# Patient Record
Sex: Female | Born: 1965
Health system: Southern US, Community
[De-identification: ages and names within clinical notes are randomized; demographics above are authoritative.]

## PROBLEM LIST (undated history)

## (undated) DIAGNOSIS — I10 Essential (primary) hypertension: Secondary | ICD-10-CM

## (undated) DIAGNOSIS — J449 Chronic obstructive pulmonary disease, unspecified: Secondary | ICD-10-CM

## (undated) HISTORY — PX: ABDOMINAL HYSTERECTOMY: SHX81

## (undated) HISTORY — PX: BACK SURGERY: SHX140

## (undated) HISTORY — PX: KNEE SURGERY: SHX244

## (undated) HISTORY — PX: BREAST LUMPECTOMY: SHX2

## (undated) HISTORY — PX: APPENDECTOMY: SHX54

---

## 2016-01-01 ENCOUNTER — Other Ambulatory Visit (HOSPITAL_BASED_OUTPATIENT_CLINIC_OR_DEPARTMENT_OTHER): Payer: Self-pay | Admitting: Internal Medicine

## 2016-01-01 ENCOUNTER — Ambulatory Visit (HOSPITAL_BASED_OUTPATIENT_CLINIC_OR_DEPARTMENT_OTHER)
Admission: RE | Admit: 2016-01-01 | Discharge: 2016-01-01 | Disposition: A | Discharge status: Home / Self Care | Payer: Medicaid Other | Source: Ambulatory Visit | Attending: Internal Medicine | Admitting: Internal Medicine

## 2016-01-01 DIAGNOSIS — R932 Abnormal findings on diagnostic imaging of liver and biliary tract: Secondary | ICD-10-CM | POA: Diagnosis not present

## 2016-01-01 DIAGNOSIS — R1084 Generalized abdominal pain: Secondary | ICD-10-CM

## 2016-08-31 ENCOUNTER — Emergency Department (HOSPITAL_BASED_OUTPATIENT_CLINIC_OR_DEPARTMENT_OTHER): Payer: Medicaid Other

## 2016-08-31 ENCOUNTER — Emergency Department (HOSPITAL_BASED_OUTPATIENT_CLINIC_OR_DEPARTMENT_OTHER)
Admission: EM | Admit: 2016-08-31 | Discharge: 2016-08-31 | Disposition: A | Discharge status: Other Acute Inpatient Hospital | Payer: Medicaid Other | Attending: Emergency Medicine | Admitting: Emergency Medicine

## 2016-08-31 ENCOUNTER — Encounter (HOSPITAL_BASED_OUTPATIENT_CLINIC_OR_DEPARTMENT_OTHER): Payer: Self-pay | Admitting: *Deleted

## 2016-08-31 DIAGNOSIS — J449 Chronic obstructive pulmonary disease, unspecified: Secondary | ICD-10-CM | POA: Diagnosis not present

## 2016-08-31 DIAGNOSIS — I10 Essential (primary) hypertension: Secondary | ICD-10-CM | POA: Diagnosis not present

## 2016-08-31 DIAGNOSIS — R509 Fever, unspecified: Secondary | ICD-10-CM | POA: Diagnosis present

## 2016-08-31 DIAGNOSIS — R7989 Other specified abnormal findings of blood chemistry: Secondary | ICD-10-CM

## 2016-08-31 DIAGNOSIS — Z5181 Encounter for therapeutic drug level monitoring: Secondary | ICD-10-CM | POA: Diagnosis not present

## 2016-08-31 DIAGNOSIS — R945 Abnormal results of liver function studies: Secondary | ICD-10-CM | POA: Insufficient documentation

## 2016-08-31 DIAGNOSIS — A419 Sepsis, unspecified organism: Secondary | ICD-10-CM | POA: Insufficient documentation

## 2016-08-31 DIAGNOSIS — N390 Urinary tract infection, site not specified: Secondary | ICD-10-CM | POA: Diagnosis not present

## 2016-08-31 DIAGNOSIS — F17210 Nicotine dependence, cigarettes, uncomplicated: Secondary | ICD-10-CM | POA: Diagnosis not present

## 2016-08-31 HISTORY — DX: Chronic obstructive pulmonary disease, unspecified: J44.9

## 2016-08-31 HISTORY — DX: Essential (primary) hypertension: I10

## 2016-08-31 LAB — URINE MICROSCOPIC-ADD ON

## 2016-08-31 LAB — COMPREHENSIVE METABOLIC PANEL
ALBUMIN: 3.2 g/dL — AB (ref 3.5–5.0)
ALK PHOS: 146 U/L — AB (ref 38–126)
ALT: 91 U/L — ABNORMAL HIGH (ref 14–54)
AST: 168 U/L — AB (ref 15–41)
Anion gap: 13 (ref 5–15)
BILIRUBIN TOTAL: 2.4 mg/dL — AB (ref 0.3–1.2)
BUN: 10 mg/dL (ref 6–20)
CO2: 23 mmol/L (ref 22–32)
Calcium: 8.6 mg/dL — ABNORMAL LOW (ref 8.9–10.3)
Chloride: 91 mmol/L — ABNORMAL LOW (ref 101–111)
Creatinine, Ser: 0.87 mg/dL (ref 0.44–1.00)
GFR calc Af Amer: >60 mL/min (ref >60)
GFR calc non Af Amer: >60 mL/min (ref >60)
GLUCOSE: 100 mg/dL — AB (ref 65–99)
POTASSIUM: 3.4 mmol/L — AB (ref 3.5–5.1)
Sodium: 127 mmol/L — ABNORMAL LOW (ref 135–145)
TOTAL PROTEIN: 7.3 g/dL (ref 6.5–8.1)

## 2016-08-31 LAB — RAPID URINE DRUG SCREEN, HOSP PERFORMED
AMPHETAMINES: NOT DETECTED
BENZODIAZEPINES: NOT DETECTED
Barbiturates: NOT DETECTED
Cocaine: NOT DETECTED
OPIATES: NOT DETECTED
Tetrahydrocannabinol: POSITIVE — AB

## 2016-08-31 LAB — URINALYSIS, ROUTINE W REFLEX MICROSCOPIC
Glucose, UA: NEGATIVE mg/dL
Ketones, ur: 15 mg/dL — AB
NITRITE: POSITIVE — AB
PH: 6 (ref 5.0–8.0)
Protein, ur: 100 mg/dL — AB
SPECIFIC GRAVITY, URINE: 1.018 (ref 1.005–1.030)

## 2016-08-31 LAB — CBC WITH DIFFERENTIAL/PLATELET
BAND NEUTROPHILS: 1 %
BASOS PCT: 0 %
Basophils Absolute: 0 10*3/uL (ref 0.0–0.1)
EOS ABS: 0 10*3/uL (ref 0.0–0.7)
EOS PCT: 0 %
HEMATOCRIT: 41.7 % (ref 36.0–46.0)
HEMOGLOBIN: 14.8 g/dL (ref 12.0–15.0)
Lymphocytes Relative: 18 %
Lymphs Abs: 2 10*3/uL (ref 0.7–4.0)
MCH: 35.9 pg — AB (ref 26.0–34.0)
MCHC: 35.5 g/dL (ref 30.0–36.0)
MCV: 101.2 fL — AB (ref 78.0–100.0)
MONOS PCT: 5 %
Monocytes Absolute: 0.6 10*3/uL (ref 0.1–1.0)
NEUTROS ABS: 8.6 10*3/uL — AB (ref 1.7–7.7)
Neutrophils Relative %: 76 %
Platelets: 97 10*3/uL — ABNORMAL LOW (ref 150–400)
RBC: 4.12 MIL/uL (ref 3.87–5.11)
RDW: 13 % (ref 11.5–15.5)
WBC: 11.2 10*3/uL — ABNORMAL HIGH (ref 4.0–10.5)

## 2016-08-31 LAB — ETHANOL: Alcohol, Ethyl (B): <5 mg/dL (ref <5)

## 2016-08-31 LAB — I-STAT CG4 LACTIC ACID, ED
Lactic Acid, Venous: 2.3 mmol/L (ref 0.5–1.9)
Lactic Acid, Venous: 1.28 mmol/L (ref 0.5–1.9)

## 2016-08-31 LAB — RAPID STREP SCREEN (MED CTR MEBANE ONLY): STREPTOCOCCUS, GROUP A SCREEN (DIRECT): NEGATIVE

## 2016-08-31 LAB — LIPASE, BLOOD: Lipase: 32 U/L (ref 11–51)

## 2016-08-31 LAB — SALICYLATE LEVEL: Salicylate Lvl: <7 mg/dL (ref 2.8–30.0)

## 2016-08-31 LAB — INFLUENZA PANEL BY PCR (TYPE A & B)
Influenza A By PCR: NEGATIVE
Influenza B By PCR: NEGATIVE

## 2016-08-31 LAB — PROTIME-INR
INR: 1.01
Prothrombin Time: 13.3 seconds (ref 11.4–15.2)

## 2016-08-31 LAB — ACETAMINOPHEN LEVEL

## 2016-08-31 LAB — AMMONIA: Ammonia: 41 umol/L — ABNORMAL HIGH (ref 9–35)

## 2016-08-31 MED ORDER — ONDANSETRON HCL 4 MG/2ML IJ SOLN
4.0000 mg | Freq: Once | INTRAMUSCULAR | Status: AC
  Administered 2016-08-31: 4 mg via INTRAVENOUS
  Filled 2016-08-31: qty 2

## 2016-08-31 MED ORDER — PIPERACILLIN-TAZOBACTAM 3.375 G IVPB 30 MIN
3.3750 g | Freq: Once | INTRAVENOUS | Status: AC
  Administered 2016-08-31: 3.375 g via INTRAVENOUS
  Filled 2016-08-31 (×2): qty 50

## 2016-08-31 MED ORDER — SODIUM CHLORIDE 0.9 % IV BOLUS (SEPSIS)
1000.0000 mL | Freq: Once | INTRAVENOUS | Status: AC
  Administered 2016-08-31: 1000 mL via INTRAVENOUS

## 2016-08-31 MED ORDER — VANCOMYCIN HCL IN DEXTROSE 1-5 GM/200ML-% IV SOLN
1000.0000 mg | Freq: Once | INTRAVENOUS | Status: AC
  Administered 2016-08-31: 1000 mg via INTRAVENOUS
  Filled 2016-08-31: qty 200

## 2016-08-31 MED ORDER — IOPAMIDOL (ISOVUE-300) INJECTION 61%
100.0000 mL | Freq: Once | INTRAVENOUS | Status: AC | PRN
  Administered 2016-08-31: 100 mL via INTRAVENOUS

## 2016-08-31 MED ORDER — SODIUM CHLORIDE 0.9 % IV BOLUS (SEPSIS)
2000.0000 mL | Freq: Once | INTRAVENOUS | Status: AC
  Administered 2016-08-31: 2000 mL via INTRAVENOUS

## 2016-08-31 NOTE — ED Notes (Addendum)
[facility]   Recalled [place] hospitalist for admission

## 2016-08-31 NOTE — ED Provider Notes (Signed)
[facility]-EMERGENCY DEPT [facility] Provider Note   CSN: [id number] Arrival date & time: 08/31/16  1143     History   Chief Complaint Chief Complaint  Patient presents with  . Fever  . Emesis    HPI Sarah Jordan is a 50 y.o. female.  HPI Patient presents with multiple episodes of vomiting starting Saturday. She's had diffuse body aches, fever and chills. She's had no bowel movement. She's having intermittent right sided upper abdominal pain. Chronic back pain which she does not believe has changed significantly. No neck stiffness. No known sick contacts. Past Medical History:  Diagnosis Date  . COPD (chronic obstructive pulmonary disease) (HCC)   . Hypertension     There are no active problems to display for this patient.   Past Surgical History:  Procedure Laterality Date  . ABDOMINAL HYSTERECTOMY    . APPENDECTOMY    . BACK SURGERY    . BREAST LUMPECTOMY    . CESAREAN SECTION    . KNEE SURGERY      OB History    No data available       Home Medications    Prior to Admission medications   Not on File    Family History No family history on file.  Social History Social History  Substance Use Topics  . Smoking status: Current Every Day Smoker    Packs/day: 1.00    Types: Cigarettes  . Smokeless tobacco: Never Used  . Alcohol use Yes     Comment: 2 drinks/ week     Allergies   Lyrica [pregabalin]   Review of Systems Review of Systems  Constitutional: Positive for appetite change, chills, fatigue and fever.  HENT: Negative for congestion, sinus pain and sore throat.   Eyes: Negative for visual disturbance.  Respiratory: Negative for cough and shortness of breath.   Cardiovascular: Negative for chest pain.  Gastrointestinal: Positive for abdominal pain, nausea and vomiting. Negative for blood in stool, constipation and diarrhea.  Genitourinary: Positive for flank pain. Negative for difficulty urinating, dysuria and hematuria.  Musculoskeletal:  Positive for back pain, myalgias and neck pain. Negative for neck stiffness.  Skin: Negative for rash and wound.  Neurological: Positive for weakness (generalized), light-headedness and headaches. Negative for numbness.  All other systems reviewed and are negative.    Physical Exam Updated Vital Signs BP 119/85   Pulse 99   Temp 98.1 F (36.7 C) (Oral)   Resp 15   Ht 5\' 10"  (1.778 m)   Wt 187 lb (84.8 kg)   SpO2 94%   BMI 26.83 kg/m   Physical Exam  Constitutional: She is oriented to person, place, and time. She appears well-developed and well-nourished. No distress.  HENT:  Head: Normocephalic and atraumatic.  Mouth/Throat: Oropharynx is clear and moist.  Oropharynx is erythematous.  Eyes: EOM are normal. Pupils are equal, round, and reactive to light. Right eye exhibits no discharge. Left eye exhibits no discharge.  Neck: Normal range of motion. Neck supple.  No meningismus  Cardiovascular: Normal rate and regular rhythm.  Exam reveals no gallop.   No murmur heard. Pulmonary/Chest: Effort normal and breath sounds normal.  Mildly diminished breath sounds in bilateral bases  Abdominal: Soft. Bowel sounds are normal. There is tenderness (mild diffuse abdominal tenderness). There is no rebound and no guarding.  Musculoskeletal: Normal range of motion. She exhibits no edema or tenderness.  Patient has diffuse thoracic and lumbar muscular tenderness. No definite CVA tenderness.  Lymphadenopathy:    She has  no cervical adenopathy.  Neurological: She is oriented to person, place, and time.  Patient appears mildly confused. 5/5 strength in all extremities. Sensation is fully intact.  Skin: Skin is warm and dry. Capillary refill takes less than 2 seconds. No rash noted. No erythema.  Psychiatric: She has a normal mood and affect. Her behavior is normal.  Nursing note and vitals reviewed.    ED Treatments / Results  Labs (all labs ordered are listed, but only abnormal results  are displayed) Labs Reviewed  URINALYSIS, ROUTINE W REFLEX MICROSCOPIC (NOT AT [facility]) - Abnormal; Notable for the following:       Result Value   Color, Urine ORANGE (*)    APPearance TURBID (*)    Hgb urine dipstick SMALL (*)    Bilirubin Urine LARGE (*)    Ketones, ur 15 (*)    Protein, ur 100 (*)    Nitrite POSITIVE (*)    Leukocytes, UA LARGE (*)    All other components within normal limits  RAPID URINE DRUG SCREEN, HOSP PERFORMED - Abnormal; Notable for the following:    Tetrahydrocannabinol POSITIVE (*)    All other components within normal limits  ACETAMINOPHEN LEVEL - Abnormal; Notable for the following:    Acetaminophen (Tylenol), Serum <10 (*)    All other components within normal limits  AMMONIA - Abnormal; Notable for the following:    Ammonia 41 (*)    All other components within normal limits  COMPREHENSIVE METABOLIC PANEL - Abnormal; Notable for the following:    Sodium 127 (*)    Potassium 3.4 (*)    Chloride 91 (*)    Glucose, Bld 100 (*)    Calcium 8.6 (*)    Albumin 3.2 (*)    AST 168 (*)    ALT 91 (*)    Alkaline Phosphatase 146 (*)    Total Bilirubin 2.4 (*)    All other components within normal limits  CBC WITH DIFFERENTIAL/PLATELET - Abnormal; Notable for the following:    WBC 11.2 (*)    MCV 101.2 (*)    MCH 35.9 (*)    Platelets 97 (*)    Neutro Abs 8.6 (*)    All other components within normal limits  URINE MICROSCOPIC-ADD ON - Abnormal; Notable for the following:    Squamous Epithelial / LPF 6-30 (*)    Bacteria, UA MANY (*)    All other components within normal limits  I-STAT CG4 LACTIC ACID, ED - Abnormal; Notable for the following:    Lactic Acid, Venous 2.30 (*)    All other components within normal limits  RAPID STREP SCREEN (NOT AT [facility])  URINE CULTURE  CULTURE, BLOOD (ROUTINE X 2)  CULTURE, BLOOD (ROUTINE X 2)  LIPASE, BLOOD  ETHANOL  PROTIME-INR  SALICYLATE LEVEL  CBC WITH DIFFERENTIAL/PLATELET  INFLUENZA PANEL BY PCR (TYPE A  & B, H1N1)  I-STAT CG4 LACTIC ACID, ED    EKG  EKG Interpretation  Date/Time:  Tuesday August 31 2016 11:59:21 EST Ventricular Rate:  109 PR Interval:    QRS Duration: 99 QT Interval:  328 QTC Calculation: 442 R Axis:   35 Text Interpretation:  Sinus tachycardia Low voltage, precordial leads Probable anteroseptal infarct, old Confirmed by Ranae PalmsYELVERTON  MD, Rambo Sarafian ([id number]) on 08/31/2016 2:05:25 PM       Radiology Dg Chest 2 View  Result Date: 08/31/2016 CLINICAL DATA:  Fever EXAM: CHEST  2 VIEW COMPARISON:  None. FINDINGS: Normal heart size. Normal mediastinal contour. No pneumothorax.  No pleural effusion. Minimal scarring versus atelectasis at the left costophrenic angle. No pulmonary edema. No acute consolidative airspace disease. IMPRESSION: Minimal scarring versus atelectasis at the left costophrenic angle. Otherwise no active disease in the chest. Electronically Signed   By: Delbert Phenix M.D.   On: 08/31/2016 12:36   Ct Abdomen Pelvis W Contrast  Result Date: 08/31/2016 CLINICAL DATA:  Abdominal pain with fever EXAM: CT ABDOMEN AND PELVIS WITH CONTRAST TECHNIQUE: Multidetector CT imaging of the abdomen and pelvis was performed using the standard protocol following bolus administration of intravenous contrast. CONTRAST:  ISOVUE-300 IOPAMIDOL (ISOVUE-300) INJECTION 61% COMPARISON:  None. FINDINGS: Lower chest: There is slight bibasilar atelectasis. There is a small hiatal hernia. Hepatobiliary: There is hepatic steatosis. No focal liver lesions are evident. Gallbladder wall is not appreciably thickened. There is no biliary duct dilatation. Pancreas: There is no pancreatic mass inflammatory focus. Spleen: No splenic lesions are evident. Adrenals/Urinary Tract: Adrenals appear unremarkable bilaterally. Kidneys bilaterally show no evidence of mass or hydronephrosis on either side. There is no renal or ureteral calculus on either side. Urinary bladder is midline with wall thickness  within normal limits. Stomach/Bowel: There is no bowel wall or mesenteric thickening. There is no bowel obstruction. There is no free air or portal venous air. Vascular/Lymphatic: There are foci of atherosclerotic calcification in the aorta. There is no demonstrable abdominal aortic aneurysm. The major mesenteric vessels appear patent. There is no appreciable adenopathy abdomen or pelvis. Reproductive: Uterus is absent. There is no pelvic mass pelvic fluid collection. Other: Appendix absent. No periappendiceal region inflammation. There is no demonstrable ascites or abscess in the abdomen or pelvis. Musculoskeletal: There is postoperative change at L5-S1 with support hardware intact. There is degenerative change at L5-S1. There is also degenerative type change in the lower thoracic and upper lumbar regions with vacuum phenomenon at T12-L1 and L1-2. There are no blastic or lytic bone lesions. There is no intramuscular or abdominal wall lesions. IMPRESSION: No bowel wall or mesenteric thickening. No bowel obstruction. No abscess. Appendix absent. No renal or ureteral calculus. No hydronephrosis. Hepatic steatosis without focal liver lesion. Small hiatal hernia. Postoperative change at L5-S1. Areas of degenerative change in the lower thoracic upper lumbar regions as well as L5-S1. Aortic atherosclerosis. Electronically Signed   By: Bretta Bang III M.D.   On: 08/31/2016 15:10   US Abdomen Limited  Result Date: 08/31/2016 CLINICAL DATA:  Nausea vomiting and right upper quadrant pain for 4 days EXAM: US ABDOMEN LIMITED - RIGHT UPPER QUADRANT COMPARISON:  01/01/2016 FINDINGS: Gallbladder: Small amount of echogenic sludge is present within the gallbladder lumen. No wall thickening. Negative sonographic Murphy sign. Common bile duct: Diameter: 2.9 mm Liver: No focal hepatic abnormalities. Diffuse increased echogenicity consistent with fatty infiltration. IMPRESSION: 1. Small amount of sludge within the gallbladder  lumen. No wall thickening and negative sonographic Murphy's. Normal common bile duct diameter. 2. Diffuse increased echogenicity of the hepatic parenchyma consistent with fatty infiltration. Electronically Signed   By: Jasmine Pang M.D.   On: 08/31/2016 14:22    Procedures Procedures (including critical care time)  Medications Ordered in ED Medications  vancomycin (VANCOCIN) IVPB 1000 mg/200 mL premix (1,000 mg Intravenous New Bag/Given 08/31/16 1515)  sodium chloride 0.9 % bolus 2,000 mL (0 mLs Intravenous Stopped 08/31/16 1547)  ondansetron (ZOFRAN) injection 4 mg (4 mg Intravenous Given 08/31/16 1234)  piperacillin-tazobactam (ZOSYN) IVPB 3.375 g (0 g Intravenous Stopped 08/31/16 1445)  sodium chloride 0.9 % bolus 1,000 mL (1,000  mLs Intravenous New Bag/Given 08/31/16 1520)  iopamidol (ISOVUE-300) 61 % injection 100 mL (100 mLs Intravenous Contrast Given 08/31/16 1451)     Initial Impression / Assessment and Plan / ED Course  I have reviewed the triage vital signs and the nursing notes.  Pertinent labs & imaging results that were available during my care of the patient were reviewed by me and considered in my medical decision making (see chart for details).  Clinical Course    Lactic acid is improved with IV fluids. Patient's heart rate has normalized as well as blood pressure. Suspect patient may have pyelonephritis. Start on IV antibiotics in the emergency department. Will speak with hospitalist at [facility] regarding transfer and admission.  Final Clinical Impressions(s) / ED Diagnoses   Final diagnoses:  Sepsis due to urinary tract infection [facility])  Liver function test abnormality   Discussed with hospitalist at [facility]. Will accept in transfer to MedSurg bed. New Prescriptions New Prescriptions   No medications on file     Loren Racer, MD 08/31/16 [phone]

## 2016-08-31 NOTE — ED Notes (Signed)
Patient to [facility] with HP1

## 2016-08-31 NOTE — ED Triage Notes (Signed)
Pt reports fever, nausea, vomiting, body aches since Saturday. States PCP told her she has the flu (no testing done) and sent her here for IV fluids

## 2016-08-31 NOTE — ED Triage Notes (Signed)
Dr. Ranae PalmsYelverton made aware of pt's VS and lactic acid 2.30

## 2016-09-01 LAB — BLOOD CULTURE ID PANEL (REFLEXED)
Acinetobacter baumannii: NOT DETECTED
CARBAPENEM RESISTANCE: NOT DETECTED
Candida albicans: NOT DETECTED
Candida glabrata: NOT DETECTED
Candida krusei: NOT DETECTED
Candida parapsilosis: NOT DETECTED
Candida tropicalis: NOT DETECTED
ENTEROBACTERIACEAE SPECIES: DETECTED — AB
ENTEROCOCCUS SPECIES: NOT DETECTED
Enterobacter cloacae complex: NOT DETECTED
Escherichia coli: DETECTED — AB
HAEMOPHILUS INFLUENZAE: NOT DETECTED
Klebsiella oxytoca: NOT DETECTED
Klebsiella pneumoniae: NOT DETECTED
LISTERIA MONOCYTOGENES: NOT DETECTED
Neisseria meningitidis: NOT DETECTED
PSEUDOMONAS AERUGINOSA: NOT DETECTED
Proteus species: NOT DETECTED
STAPHYLOCOCCUS AUREUS BCID: NOT DETECTED
STAPHYLOCOCCUS SPECIES: NOT DETECTED
STREPTOCOCCUS PNEUMONIAE: NOT DETECTED
STREPTOCOCCUS PYOGENES: NOT DETECTED
STREPTOCOCCUS SPECIES: NOT DETECTED
Serratia marcescens: NOT DETECTED
Streptococcus agalactiae: NOT DETECTED

## 2016-09-01 NOTE — ED Notes (Signed)
Maria, micro lab notifies this rn that pt aerobic blood culture has grown out e. Coli and gram neg rods. This rn phones [facility] "Mark, Nursing Supervisor" alerted to culture results and states will follow up. Micro lab phoned and requested to fax results to [facility] at [phone]. [facility].

## 2016-09-03 LAB — CULTURE, GROUP A STREP (THRC)

## 2016-09-03 LAB — URINE CULTURE

## 2016-09-03 LAB — CULTURE, BLOOD (ROUTINE X 2)

## 2016-09-04 ENCOUNTER — Telehealth (HOSPITAL_BASED_OUTPATIENT_CLINIC_OR_DEPARTMENT_OTHER): Payer: Self-pay

## 2016-09-04 NOTE — Progress Notes (Signed)
ED Antimicrobial Stewardship Positive Culture Follow Up   Sarah Jordan is an 50 y.o. female who [facility] to [facility] with a chief complaint of fever and emesis and was started on vancomycin and zosyn for sepsis. Chief Complaint  Patient presents with  . Fever  . Emesis    Recent Results (from the past 720 hour(s))  Culture, blood (Routine x 2)     Status: None (Preliminary result)   Collection Time: 08/31/16 12:05 PM  Result Value Ref Range Status   Specimen Description BLOOD RIGHT HAND  Final   Special Requests BOTTLES DRAWN AEROBIC AND ANAEROBIC 5CC EACH  Final   Culture   Final    NO GROWTH 3 DAYS [facility]    Report Status PENDING  Incomplete  Culture, blood (Routine x 2)     Status: Abnormal   Collection Time: 08/31/16 12:43 PM  Result Value Ref Range Status   Specimen Description BLOOD RIGHT AC  Final   Special Requests BOTTLES DRAWN AEROBIC AND ANAEROBIC 5CC EACH  Final   Culture  Setup Time   Final    GRAM NEGATIVE RODS AEROBIC BOTTLE ONLY CRITICAL RESULT CALLED TO, READ BACK BY AND VERIFIED WITH: AMY HARTLEY,RN @0722  09/01/16 MKELLY,MLT Performed [facility]    Culture ESCHERICHIA COLI (A)  Final   Report Status 09/03/2016 FINAL  Final   Organism ID, Bacteria ESCHERICHIA COLI  Final      Susceptibility   Escherichia coli - MIC*    AMPICILLIN >=32 RESISTANT Resistant     CEFAZOLIN <=4 SENSITIVE Sensitive     CEFEPIME <=1 SENSITIVE Sensitive     CEFTAZIDIME <=1 SENSITIVE Sensitive     CEFTRIAXONE <=1 SENSITIVE Sensitive     CIPROFLOXACIN <=0.25 SENSITIVE Sensitive     GENTAMICIN >=16 RESISTANT Resistant     IMIPENEM <=0.25 SENSITIVE Sensitive     TRIMETH/SULFA >=320 RESISTANT Resistant     AMPICILLIN/SULBACTAM >=32 RESISTANT Resistant     PIP/TAZO <=4 SENSITIVE Sensitive     Extended ESBL NEGATIVE Sensitive     * ESCHERICHIA COLI  Blood Culture ID Panel (Reflexed)     Status: Abnormal   Collection Time:  08/31/16 12:43 PM  Result Value Ref Range Status   Enterococcus species NOT DETECTED NOT DETECTED Final   Listeria monocytogenes NOT DETECTED NOT DETECTED Final   Staphylococcus species NOT DETECTED NOT DETECTED Final   Staphylococcus aureus NOT DETECTED NOT DETECTED Final   Streptococcus species NOT DETECTED NOT DETECTED Final   Streptococcus agalactiae NOT DETECTED NOT DETECTED Final   Streptococcus pneumoniae NOT DETECTED NOT DETECTED Final   Streptococcus pyogenes NOT DETECTED NOT DETECTED Final   Acinetobacter baumannii NOT DETECTED NOT DETECTED Final   Enterobacteriaceae species DETECTED (A) NOT DETECTED Final    Comment: CRITICAL RESULT CALLED TO, READ BACK BY AND VERIFIED WITH: AMY HARTLEY,RN @0722  09/01/16 MKELLY    Enterobacter cloacae complex NOT DETECTED NOT DETECTED Final   Escherichia coli DETECTED (A) NOT DETECTED Final    Comment: CRITICAL RESULT CALLED TO, READ BACK BY AND VERIFIED WITH: AMY HARTLEY,RN @0722  09/01/16 MKELLY,MLT    Klebsiella oxytoca NOT DETECTED NOT DETECTED Final   Klebsiella pneumoniae NOT DETECTED NOT DETECTED Final   Proteus species NOT DETECTED NOT DETECTED Final   Serratia marcescens NOT DETECTED NOT DETECTED Final   Carbapenem resistance NOT DETECTED NOT DETECTED Final   Haemophilus influenzae NOT DETECTED NOT DETECTED Final   Neisseria meningitidis NOT DETECTED NOT DETECTED Final  Pseudomonas aeruginosa NOT DETECTED NOT DETECTED Final   Candida albicans NOT DETECTED NOT DETECTED Final   Candida glabrata NOT DETECTED NOT DETECTED Final   Candida krusei NOT DETECTED NOT DETECTED Final   Candida parapsilosis NOT DETECTED NOT DETECTED Final   Candida tropicalis NOT DETECTED NOT DETECTED Final    Comment: Performed at [facility]  Rapid strep screen     Status: None   Collection Time: 08/31/16  1:05 PM  Result Value Ref Range Status   Streptococcus, Group A Screen (Direct) NEGATIVE NEGATIVE Final    Comment: (NOTE) A Rapid Antigen  test may result negative if the antigen level in the sample is below the detection level of this test. The FDA has not cleared this test as a stand-alone test therefore the rapid antigen negative result has reflexed to a Group A Strep culture.   Culture, group A strep     Status: None   Collection Time: 08/31/16  1:05 PM  Result Value Ref Range Status   Specimen Description THROAT  Final   Special Requests NONE  Final   Culture   Final    NO GROUP A STREP (S.PYOGENES) ISOLATED Performed at [facility]    Report Status 09/03/2016 FINAL  Final  Urine culture     Status: Abnormal   Collection Time: 08/31/16  1:15 PM  Result Value Ref Range Status   Specimen Description URINE, RANDOM  Final   Special Requests NONE  Final   Culture >=100,000 COLONIES/mL ESCHERICHIA COLI (A)  Final   Report Status 09/03/2016 FINAL  Final   Organism ID, Bacteria ESCHERICHIA COLI (A)  Final      Susceptibility   Escherichia coli - MIC*    AMPICILLIN >=32 RESISTANT Resistant     CEFAZOLIN <=4 SENSITIVE Sensitive     CEFTRIAXONE <=1 SENSITIVE Sensitive     CIPROFLOXACIN <=0.25 SENSITIVE Sensitive     GENTAMICIN >=16 RESISTANT Resistant     IMIPENEM <=0.25 SENSITIVE Sensitive     NITROFURANTOIN <=16 SENSITIVE Sensitive     TRIMETH/SULFA <=20 SENSITIVE Sensitive     AMPICILLIN/SULBACTAM 16 INTERMEDIATE Intermediate     PIP/TAZO <=4 SENSITIVE Sensitive     Extended ESBL NEGATIVE Sensitive     * >=100,000 COLONIES/mL ESCHERICHIA COLI    [x]  Treated with vancomycin and zosyn and [facility]. Requesting that results are sent to [facility] []  Patient discharged originally without antimicrobial agent and treatment is now indicated  New antibiotic prescription: Would recommend ceftriaxone based on sensitivities.  ED Provider: Azucena FreedAbigail Harris   Sarah Jordan, PharmD, BCPS Clinical Pharmacist 09/04/2016, 9:17 AM Infectious Diseases Pharmacist

## 2016-09-04 NOTE — Telephone Encounter (Signed)
UC and BC report [facility] [phone] Pt in [phone] [phone]

## 2016-09-05 LAB — CULTURE, BLOOD (ROUTINE X 2): CULTURE: NO GROWTH

## 2017-03-21 ENCOUNTER — Other Ambulatory Visit: Payer: Self-pay | Admitting: Internal Medicine

## 2017-03-21 DIAGNOSIS — J449 Chronic obstructive pulmonary disease, unspecified: Secondary | ICD-10-CM

## 2017-03-21 DIAGNOSIS — R0602 Shortness of breath: Secondary | ICD-10-CM

## 2017-04-05 ENCOUNTER — Ambulatory Visit
Admission: RE | Admit: 2017-04-05 | Discharge: 2017-04-05 | Disposition: A | Discharge status: Home / Self Care | Payer: Medicaid Other | Source: Ambulatory Visit | Attending: Internal Medicine | Admitting: Internal Medicine

## 2017-04-05 DIAGNOSIS — R0602 Shortness of breath: Secondary | ICD-10-CM

## 2017-04-05 DIAGNOSIS — J449 Chronic obstructive pulmonary disease, unspecified: Secondary | ICD-10-CM

## 2017-04-05 MED ORDER — IOPAMIDOL (ISOVUE-300) INJECTION 61%
75.0000 mL | Freq: Once | INTRAVENOUS | Status: AC | PRN
  Administered 2017-04-05: 75 mL via INTRAVENOUS

## 2017-04-12 DIAGNOSIS — J309 Allergic rhinitis, unspecified: Secondary | ICD-10-CM | POA: Diagnosis not present

## 2017-04-12 DIAGNOSIS — J449 Chronic obstructive pulmonary disease, unspecified: Secondary | ICD-10-CM | POA: Diagnosis not present

## 2017-04-12 DIAGNOSIS — G5603 Carpal tunnel syndrome, bilateral upper limbs: Secondary | ICD-10-CM | POA: Diagnosis not present

## 2017-04-12 DIAGNOSIS — F418 Other specified anxiety disorders: Secondary | ICD-10-CM | POA: Diagnosis not present

## 2017-04-12 DIAGNOSIS — I10 Essential (primary) hypertension: Secondary | ICD-10-CM | POA: Diagnosis not present

## 2017-04-12 DIAGNOSIS — M545 Low back pain: Secondary | ICD-10-CM | POA: Diagnosis not present

## 2017-04-12 DIAGNOSIS — Z720 Tobacco use: Secondary | ICD-10-CM | POA: Diagnosis not present

## 2017-04-26 DIAGNOSIS — M25531 Pain in right wrist: Secondary | ICD-10-CM | POA: Diagnosis not present

## 2017-04-26 DIAGNOSIS — M25561 Pain in right knee: Secondary | ICD-10-CM | POA: Diagnosis not present

## 2017-04-26 DIAGNOSIS — M79605 Pain in left leg: Secondary | ICD-10-CM | POA: Diagnosis not present

## 2017-04-26 DIAGNOSIS — G894 Chronic pain syndrome: Secondary | ICD-10-CM | POA: Diagnosis not present

## 2017-04-26 DIAGNOSIS — M79662 Pain in left lower leg: Secondary | ICD-10-CM | POA: Diagnosis not present

## 2017-04-26 DIAGNOSIS — M545 Low back pain: Secondary | ICD-10-CM | POA: Diagnosis not present

## 2017-04-26 DIAGNOSIS — M79604 Pain in right leg: Secondary | ICD-10-CM | POA: Diagnosis not present

## 2017-04-26 DIAGNOSIS — M79661 Pain in right lower leg: Secondary | ICD-10-CM | POA: Diagnosis not present

## 2017-05-24 DIAGNOSIS — M79662 Pain in left lower leg: Secondary | ICD-10-CM | POA: Diagnosis not present

## 2017-05-24 DIAGNOSIS — M25532 Pain in left wrist: Secondary | ICD-10-CM | POA: Diagnosis not present

## 2017-05-24 DIAGNOSIS — M25531 Pain in right wrist: Secondary | ICD-10-CM | POA: Diagnosis not present

## 2017-05-24 DIAGNOSIS — M25561 Pain in right knee: Secondary | ICD-10-CM | POA: Diagnosis not present

## 2017-05-24 DIAGNOSIS — M545 Low back pain: Secondary | ICD-10-CM | POA: Diagnosis not present

## 2017-05-24 DIAGNOSIS — M79661 Pain in right lower leg: Secondary | ICD-10-CM | POA: Diagnosis not present

## 2017-05-24 DIAGNOSIS — Z79891 Long term (current) use of opiate analgesic: Secondary | ICD-10-CM | POA: Diagnosis not present

## 2017-05-24 DIAGNOSIS — G894 Chronic pain syndrome: Secondary | ICD-10-CM | POA: Diagnosis not present

## 2017-06-21 DIAGNOSIS — G894 Chronic pain syndrome: Secondary | ICD-10-CM | POA: Diagnosis not present

## 2017-06-21 DIAGNOSIS — M545 Low back pain: Secondary | ICD-10-CM | POA: Diagnosis not present

## 2017-06-21 DIAGNOSIS — M79605 Pain in left leg: Secondary | ICD-10-CM | POA: Diagnosis not present

## 2017-06-21 DIAGNOSIS — G890 Central pain syndrome: Secondary | ICD-10-CM | POA: Diagnosis not present

## 2017-07-12 DIAGNOSIS — J449 Chronic obstructive pulmonary disease, unspecified: Secondary | ICD-10-CM | POA: Diagnosis not present

## 2017-07-12 DIAGNOSIS — Z720 Tobacco use: Secondary | ICD-10-CM | POA: Diagnosis not present

## 2017-07-12 DIAGNOSIS — M545 Low back pain: Secondary | ICD-10-CM | POA: Diagnosis not present

## 2017-07-12 DIAGNOSIS — J40 Bronchitis, not specified as acute or chronic: Secondary | ICD-10-CM | POA: Diagnosis not present

## 2017-07-12 DIAGNOSIS — F418 Other specified anxiety disorders: Secondary | ICD-10-CM | POA: Diagnosis not present

## 2017-07-12 DIAGNOSIS — I10 Essential (primary) hypertension: Secondary | ICD-10-CM | POA: Diagnosis not present

## 2017-07-12 DIAGNOSIS — J309 Allergic rhinitis, unspecified: Secondary | ICD-10-CM | POA: Diagnosis not present

## 2017-08-02 DIAGNOSIS — M545 Low back pain: Secondary | ICD-10-CM | POA: Diagnosis not present

## 2017-08-02 DIAGNOSIS — M79605 Pain in left leg: Secondary | ICD-10-CM | POA: Diagnosis not present

## 2017-08-02 DIAGNOSIS — G894 Chronic pain syndrome: Secondary | ICD-10-CM | POA: Diagnosis not present

## 2017-08-02 DIAGNOSIS — G890 Central pain syndrome: Secondary | ICD-10-CM | POA: Diagnosis not present

## 2017-08-15 DIAGNOSIS — R200 Anesthesia of skin: Secondary | ICD-10-CM | POA: Diagnosis not present

## 2017-08-15 DIAGNOSIS — R202 Paresthesia of skin: Secondary | ICD-10-CM | POA: Diagnosis not present

## 2017-08-30 DIAGNOSIS — G603 Idiopathic progressive neuropathy: Secondary | ICD-10-CM | POA: Diagnosis not present

## 2017-09-27 DIAGNOSIS — M25561 Pain in right knee: Secondary | ICD-10-CM | POA: Diagnosis not present

## 2017-09-27 DIAGNOSIS — G894 Chronic pain syndrome: Secondary | ICD-10-CM | POA: Diagnosis not present

## 2017-09-27 DIAGNOSIS — M25571 Pain in right ankle and joints of right foot: Secondary | ICD-10-CM | POA: Diagnosis not present

## 2017-09-27 DIAGNOSIS — M545 Low back pain: Secondary | ICD-10-CM | POA: Diagnosis not present

## 2017-09-27 DIAGNOSIS — M79662 Pain in left lower leg: Secondary | ICD-10-CM | POA: Diagnosis not present

## 2017-09-27 DIAGNOSIS — M25572 Pain in left ankle and joints of left foot: Secondary | ICD-10-CM | POA: Diagnosis not present

## 2017-09-27 DIAGNOSIS — M79605 Pain in left leg: Secondary | ICD-10-CM | POA: Diagnosis not present

## 2017-09-27 DIAGNOSIS — M79604 Pain in right leg: Secondary | ICD-10-CM | POA: Diagnosis not present

## 2017-09-27 DIAGNOSIS — M79661 Pain in right lower leg: Secondary | ICD-10-CM | POA: Diagnosis not present

## 2017-10-20 DIAGNOSIS — Z720 Tobacco use: Secondary | ICD-10-CM | POA: Diagnosis not present

## 2017-10-20 DIAGNOSIS — M545 Low back pain: Secondary | ICD-10-CM | POA: Diagnosis not present

## 2017-10-20 DIAGNOSIS — J40 Bronchitis, not specified as acute or chronic: Secondary | ICD-10-CM | POA: Diagnosis not present

## 2017-10-20 DIAGNOSIS — F418 Other specified anxiety disorders: Secondary | ICD-10-CM | POA: Diagnosis not present

## 2017-10-20 DIAGNOSIS — J449 Chronic obstructive pulmonary disease, unspecified: Secondary | ICD-10-CM | POA: Diagnosis not present

## 2017-10-20 DIAGNOSIS — I10 Essential (primary) hypertension: Secondary | ICD-10-CM | POA: Diagnosis not present

## 2017-10-20 DIAGNOSIS — J309 Allergic rhinitis, unspecified: Secondary | ICD-10-CM | POA: Diagnosis not present

## 2017-10-25 DIAGNOSIS — M5432 Sciatica, left side: Secondary | ICD-10-CM | POA: Diagnosis not present

## 2017-10-25 DIAGNOSIS — M545 Low back pain: Secondary | ICD-10-CM | POA: Diagnosis not present

## 2017-10-25 DIAGNOSIS — M5431 Sciatica, right side: Secondary | ICD-10-CM | POA: Diagnosis not present

## 2017-10-25 DIAGNOSIS — R202 Paresthesia of skin: Secondary | ICD-10-CM | POA: Diagnosis not present

## 2017-10-25 DIAGNOSIS — G890 Central pain syndrome: Secondary | ICD-10-CM | POA: Diagnosis not present

## 2017-12-19 DIAGNOSIS — R202 Paresthesia of skin: Secondary | ICD-10-CM | POA: Diagnosis not present

## 2017-12-19 DIAGNOSIS — R200 Anesthesia of skin: Secondary | ICD-10-CM | POA: Diagnosis not present

## 2018-01-03 DIAGNOSIS — G5603 Carpal tunnel syndrome, bilateral upper limbs: Secondary | ICD-10-CM | POA: Diagnosis not present

## 2018-01-04 DIAGNOSIS — R200 Anesthesia of skin: Secondary | ICD-10-CM | POA: Diagnosis not present

## 2018-01-04 DIAGNOSIS — R202 Paresthesia of skin: Secondary | ICD-10-CM | POA: Diagnosis not present

## 2018-01-04 DIAGNOSIS — G5603 Carpal tunnel syndrome, bilateral upper limbs: Secondary | ICD-10-CM | POA: Diagnosis not present

## 2018-01-17 DIAGNOSIS — G5601 Carpal tunnel syndrome, right upper limb: Secondary | ICD-10-CM | POA: Diagnosis not present

## 2018-01-17 DIAGNOSIS — G5603 Carpal tunnel syndrome, bilateral upper limbs: Secondary | ICD-10-CM | POA: Diagnosis not present

## 2018-04-25 DIAGNOSIS — J449 Chronic obstructive pulmonary disease, unspecified: Secondary | ICD-10-CM | POA: Diagnosis not present

## 2018-04-25 DIAGNOSIS — N3001 Acute cystitis with hematuria: Secondary | ICD-10-CM | POA: Diagnosis not present

## 2018-04-25 DIAGNOSIS — F418 Other specified anxiety disorders: Secondary | ICD-10-CM | POA: Diagnosis not present

## 2018-04-25 DIAGNOSIS — N63 Unspecified lump in unspecified breast: Secondary | ICD-10-CM | POA: Diagnosis not present

## 2018-04-25 DIAGNOSIS — I10 Essential (primary) hypertension: Secondary | ICD-10-CM | POA: Diagnosis not present

## 2018-04-25 DIAGNOSIS — J309 Allergic rhinitis, unspecified: Secondary | ICD-10-CM | POA: Diagnosis not present

## 2018-04-25 DIAGNOSIS — R300 Dysuria: Secondary | ICD-10-CM | POA: Diagnosis not present

## 2018-04-25 DIAGNOSIS — Z720 Tobacco use: Secondary | ICD-10-CM | POA: Diagnosis not present

## 2018-04-25 DIAGNOSIS — M545 Low back pain: Secondary | ICD-10-CM | POA: Diagnosis not present

## 2018-05-05 DIAGNOSIS — G5603 Carpal tunnel syndrome, bilateral upper limbs: Secondary | ICD-10-CM | POA: Diagnosis not present

## 2018-05-05 DIAGNOSIS — R202 Paresthesia of skin: Secondary | ICD-10-CM | POA: Diagnosis not present

## 2018-05-05 DIAGNOSIS — R200 Anesthesia of skin: Secondary | ICD-10-CM | POA: Diagnosis not present

## 2018-05-10 DIAGNOSIS — N6310 Unspecified lump in the right breast, unspecified quadrant: Secondary | ICD-10-CM | POA: Diagnosis not present

## 2018-05-10 DIAGNOSIS — R921 Mammographic calcification found on diagnostic imaging of breast: Secondary | ICD-10-CM | POA: Diagnosis not present

## 2018-05-10 DIAGNOSIS — N6320 Unspecified lump in the left breast, unspecified quadrant: Secondary | ICD-10-CM | POA: Diagnosis not present

## 2018-05-11 IMAGING — CT CT ABD-PELV W/ CM
2 of 5 series · 16 of 46 positions shown, 18 images · IV contrast (APPLIED)
Comparison: None.

CLINICAL DATA: Abdominal pain with fever

EXAM:
CT ABDOMEN AND PELVIS WITH CONTRAST
TECHNIQUE: Multidetector CT imaging of the abdomen and pelvis was performed
using the standard protocol following bolus administration of
intravenous contrast.
CONTRAST:  100mL WULNRD-W44 IOPAMIDOL (WULNRD-W44) INJECTION 61%

[Series 2: axial st · axial · 0.92mm/px · z∈[-555,-120]mm · 13 of 99 slices shown, 15 images]
[im 6/99  soft-tissue]
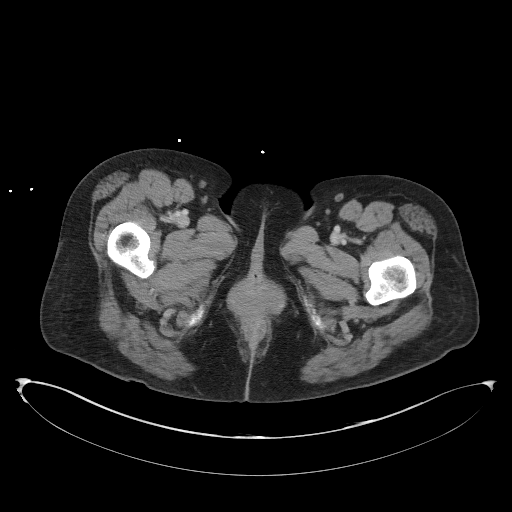
[im 6/99  bone]
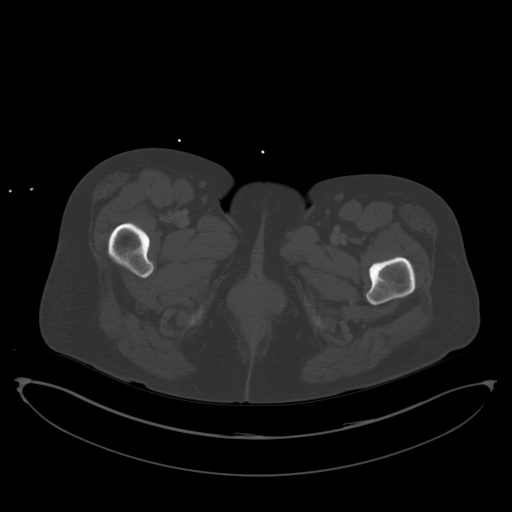
[im 16/99  soft-tissue]
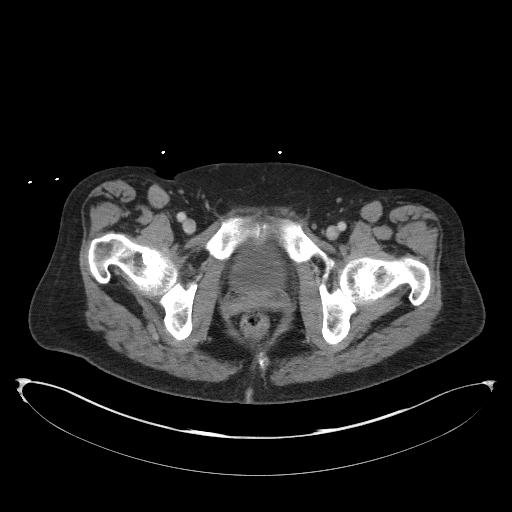
[im 21/99  soft-tissue]
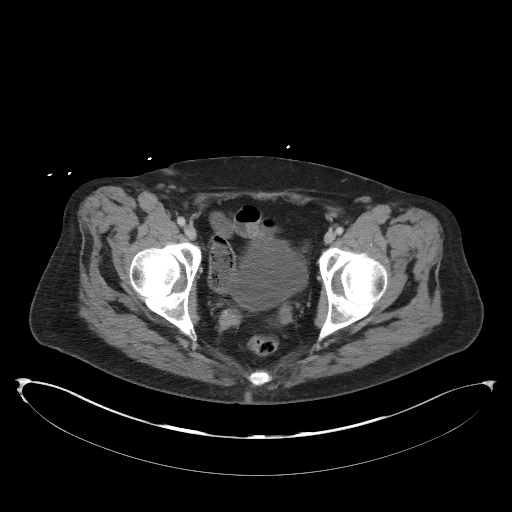
[im 26/99  soft-tissue]
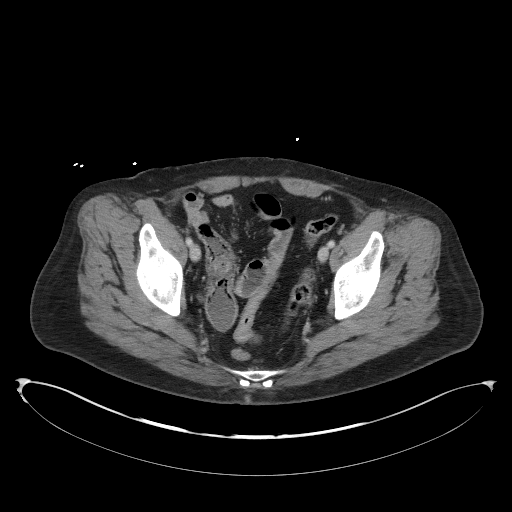
[im 37/99  soft-tissue]
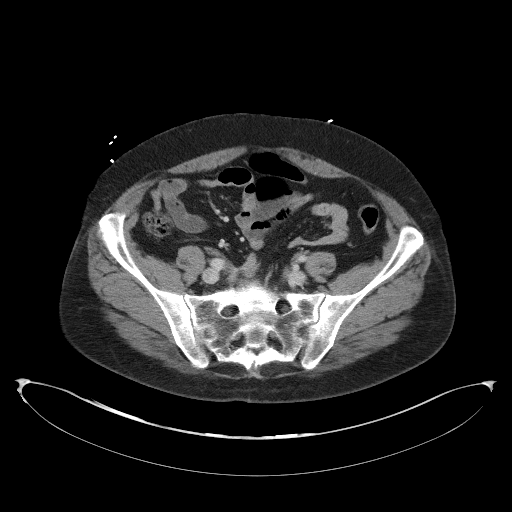
[im 42/99  soft-tissue]
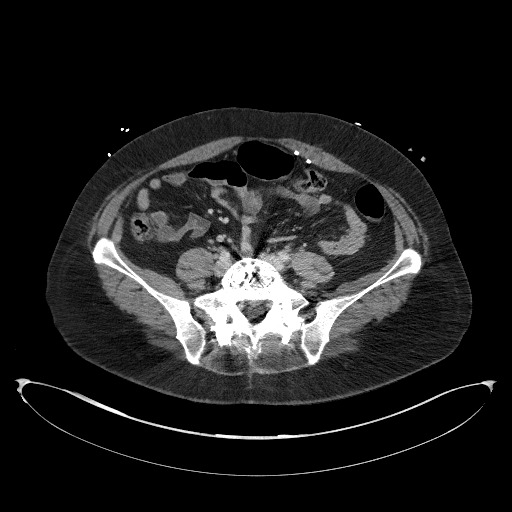
[im 52/99  soft-tissue]
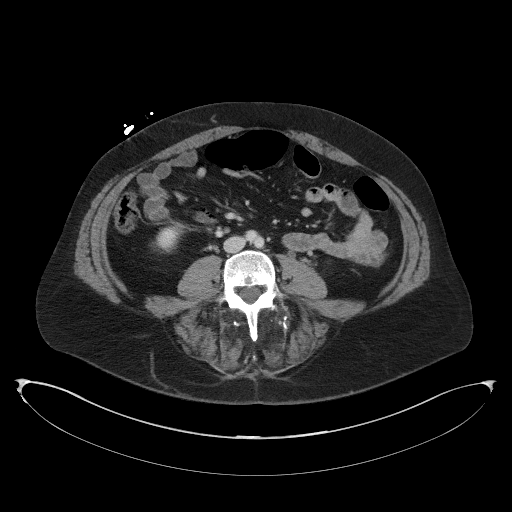
[im 57/99  soft-tissue]
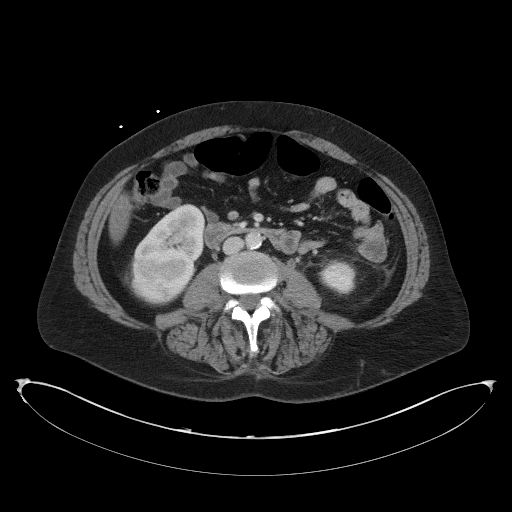
[im 62/99  soft-tissue]
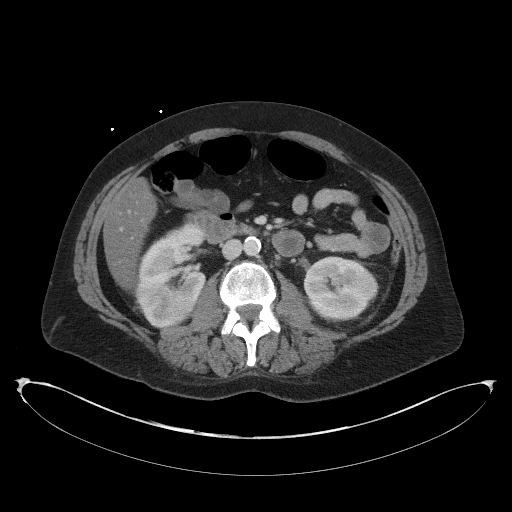
[im 62/99  bone]
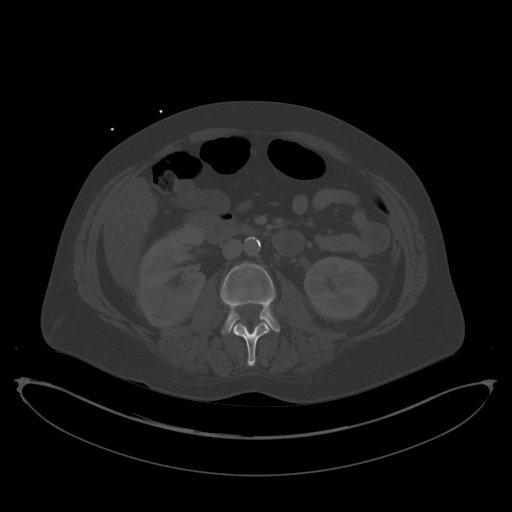
[im 73/99  soft-tissue]
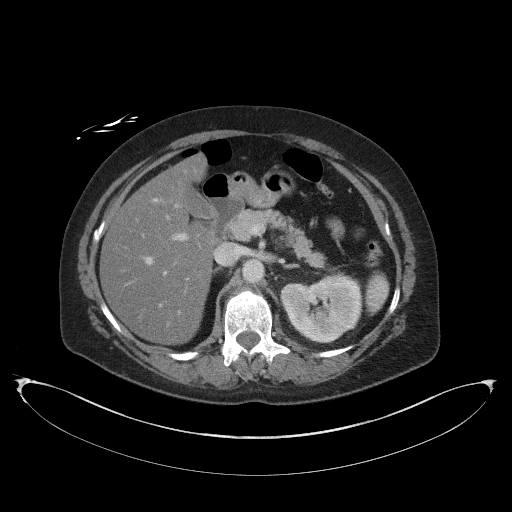
[im 78/99  soft-tissue]
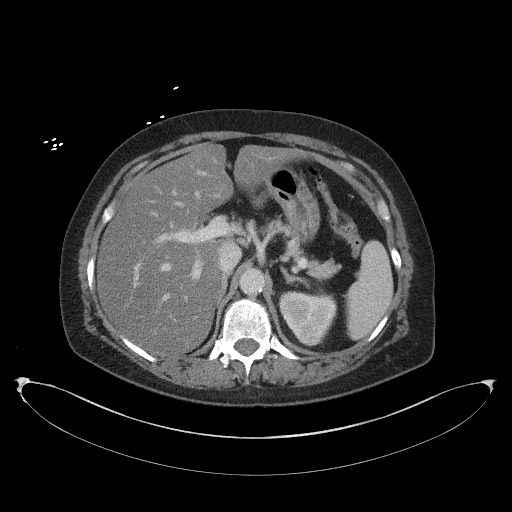
[im 83/99  soft-tissue]
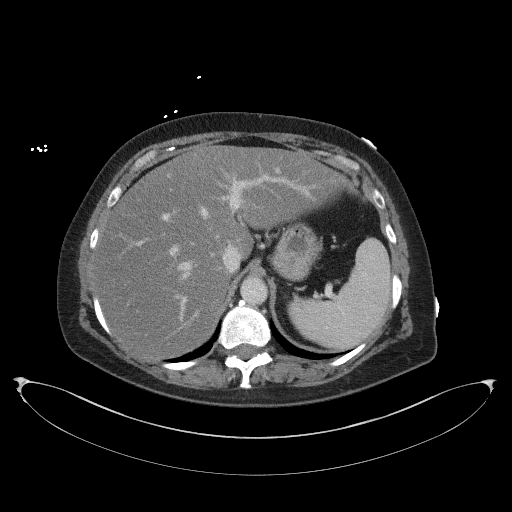
[im 93/99  soft-tissue]
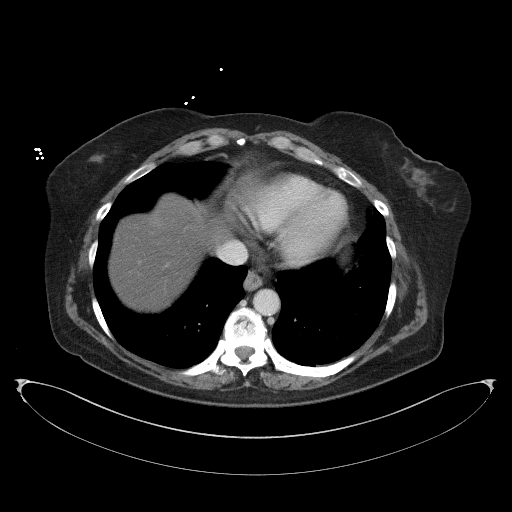

[Series 4: coronal st · coronal · 0.81mm/px · 3 of 107 slices shown]
[im 36/107  soft-tissue]
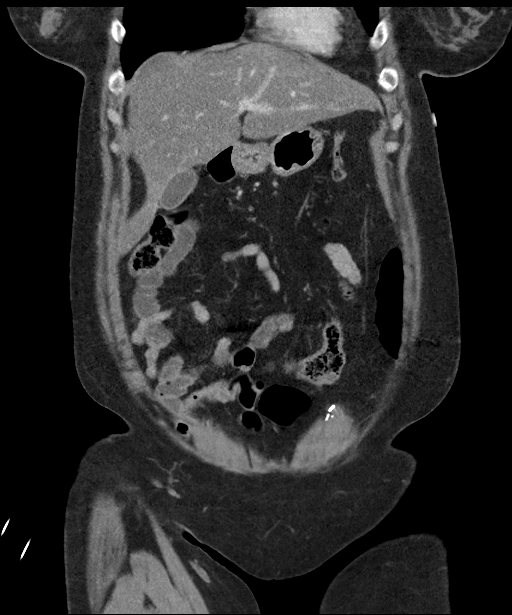
[im 48/107  soft-tissue]
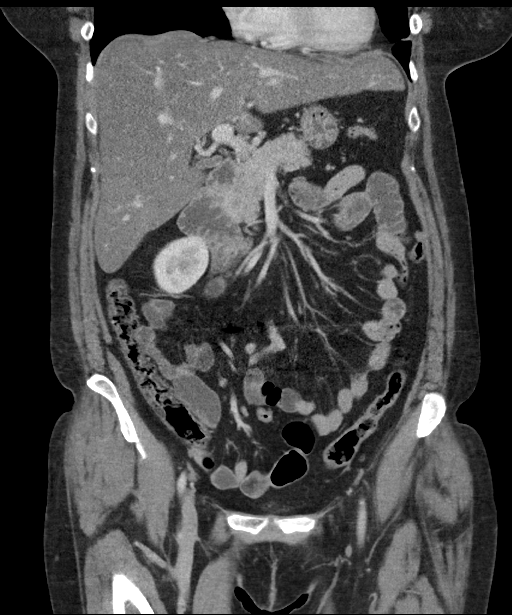
[im 59/107  soft-tissue]
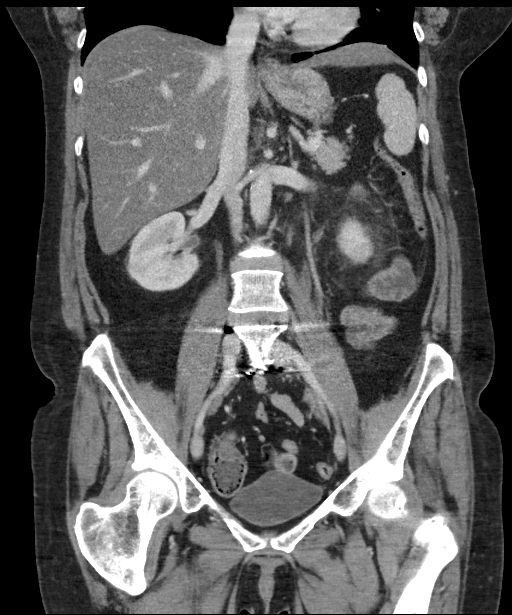

[16 of 46 positions shown; findings below may reference images not displayed]

FINDINGS: Lower chest: There is slight bibasilar atelectasis. There is a small
hiatal hernia.

Hepatobiliary: There is hepatic steatosis. No focal liver lesions
are evident. Gallbladder wall is not appreciably thickened. There is
no biliary duct dilatation.

Pancreas: There is no pancreatic mass inflammatory focus.

Spleen: No splenic lesions are evident.

Adrenals/Urinary Tract: Adrenals appear unremarkable bilaterally.
Kidneys bilaterally show no evidence of mass or hydronephrosis on
either side. There is no renal or ureteral calculus on either side.
Urinary bladder is midline with wall thickness within normal limits.

Stomach/Bowel: There is no bowel wall or mesenteric thickening.
There is no bowel obstruction. There is no free air or portal venous
air.

Vascular/Lymphatic: There are foci of atherosclerotic calcification
in the aorta. There is no demonstrable abdominal aortic aneurysm.
The major mesenteric vessels appear patent. There is no appreciable
adenopathy abdomen or pelvis.

Reproductive: Uterus is absent. There is no pelvic mass pelvic fluid
collection.

Other: Appendix absent. No periappendiceal region inflammation.
There is no demonstrable ascites or abscess in the abdomen or
pelvis.

Musculoskeletal: There is postoperative change at L5-S1 with support
hardware intact. There is degenerative change at L5-S1. There is
also degenerative type change in the lower thoracic and upper lumbar
regions with vacuum phenomenon at T12-L1 and L1-2. There are no
blastic or lytic bone lesions. There is no intramuscular or
abdominal wall lesions.
IMPRESSION: No bowel wall or mesenteric thickening. No bowel obstruction. No
abscess. Appendix absent.

No renal or ureteral calculus. No hydronephrosis. Hepatic steatosis
without focal liver lesion.

Small hiatal hernia. Postoperative change at L5-S1. Areas of
degenerative change in the lower thoracic upper lumbar regions as
well as L5-S1.

Aortic atherosclerosis.

## 2018-05-16 DIAGNOSIS — R21 Rash and other nonspecific skin eruption: Secondary | ICD-10-CM | POA: Diagnosis not present

## 2018-05-16 DIAGNOSIS — R3915 Urgency of urination: Secondary | ICD-10-CM | POA: Diagnosis not present

## 2018-05-16 DIAGNOSIS — J309 Allergic rhinitis, unspecified: Secondary | ICD-10-CM | POA: Diagnosis not present

## 2018-05-16 DIAGNOSIS — I10 Essential (primary) hypertension: Secondary | ICD-10-CM | POA: Diagnosis not present

## 2018-05-16 DIAGNOSIS — J449 Chronic obstructive pulmonary disease, unspecified: Secondary | ICD-10-CM | POA: Diagnosis not present

## 2018-05-16 DIAGNOSIS — Z720 Tobacco use: Secondary | ICD-10-CM | POA: Diagnosis not present

## 2018-05-16 DIAGNOSIS — M545 Low back pain: Secondary | ICD-10-CM | POA: Diagnosis not present

## 2018-05-16 DIAGNOSIS — F418 Other specified anxiety disorders: Secondary | ICD-10-CM | POA: Diagnosis not present

## 2018-07-18 DIAGNOSIS — R21 Rash and other nonspecific skin eruption: Secondary | ICD-10-CM | POA: Diagnosis not present

## 2018-07-18 DIAGNOSIS — R3915 Urgency of urination: Secondary | ICD-10-CM | POA: Diagnosis not present

## 2018-07-18 DIAGNOSIS — F418 Other specified anxiety disorders: Secondary | ICD-10-CM | POA: Diagnosis not present

## 2018-07-18 DIAGNOSIS — J449 Chronic obstructive pulmonary disease, unspecified: Secondary | ICD-10-CM | POA: Diagnosis not present

## 2018-07-18 DIAGNOSIS — I10 Essential (primary) hypertension: Secondary | ICD-10-CM | POA: Diagnosis not present

## 2018-07-18 DIAGNOSIS — Z720 Tobacco use: Secondary | ICD-10-CM | POA: Diagnosis not present

## 2018-07-18 DIAGNOSIS — M545 Low back pain: Secondary | ICD-10-CM | POA: Diagnosis not present

## 2018-07-18 DIAGNOSIS — J309 Allergic rhinitis, unspecified: Secondary | ICD-10-CM | POA: Diagnosis not present

## 2018-10-07 DIAGNOSIS — Z888 Allergy status to other drugs, medicaments and biological substances status: Secondary | ICD-10-CM | POA: Diagnosis not present

## 2018-10-07 DIAGNOSIS — G8929 Other chronic pain: Secondary | ICD-10-CM | POA: Diagnosis not present

## 2018-10-07 DIAGNOSIS — M545 Low back pain: Secondary | ICD-10-CM | POA: Diagnosis not present

## 2018-10-07 DIAGNOSIS — Z79899 Other long term (current) drug therapy: Secondary | ICD-10-CM | POA: Diagnosis not present

## 2018-10-07 DIAGNOSIS — F17210 Nicotine dependence, cigarettes, uncomplicated: Secondary | ICD-10-CM | POA: Diagnosis not present

## 2018-10-07 DIAGNOSIS — Z981 Arthrodesis status: Secondary | ICD-10-CM | POA: Diagnosis not present

## 2018-10-07 DIAGNOSIS — Z791 Long term (current) use of non-steroidal anti-inflammatories (NSAID): Secondary | ICD-10-CM | POA: Diagnosis not present

## 2018-10-07 DIAGNOSIS — I10 Essential (primary) hypertension: Secondary | ICD-10-CM | POA: Diagnosis not present

## 2018-12-18 DIAGNOSIS — Z1212 Encounter for screening for malignant neoplasm of rectum: Secondary | ICD-10-CM | POA: Diagnosis not present

## 2018-12-18 DIAGNOSIS — Z1211 Encounter for screening for malignant neoplasm of colon: Secondary | ICD-10-CM | POA: Diagnosis not present

## 2019-05-31 DIAGNOSIS — M545 Low back pain: Secondary | ICD-10-CM | POA: Diagnosis not present

## 2019-05-31 DIAGNOSIS — J40 Bronchitis, not specified as acute or chronic: Secondary | ICD-10-CM | POA: Diagnosis not present

## 2019-05-31 DIAGNOSIS — J449 Chronic obstructive pulmonary disease, unspecified: Secondary | ICD-10-CM | POA: Diagnosis not present

## 2019-05-31 DIAGNOSIS — Z720 Tobacco use: Secondary | ICD-10-CM | POA: Diagnosis not present

## 2019-05-31 DIAGNOSIS — R3915 Urgency of urination: Secondary | ICD-10-CM | POA: Diagnosis not present

## 2019-05-31 DIAGNOSIS — I83813 Varicose veins of bilateral lower extremities with pain: Secondary | ICD-10-CM | POA: Diagnosis not present

## 2019-05-31 DIAGNOSIS — J309 Allergic rhinitis, unspecified: Secondary | ICD-10-CM | POA: Diagnosis not present

## 2019-05-31 DIAGNOSIS — I10 Essential (primary) hypertension: Secondary | ICD-10-CM | POA: Diagnosis not present

## 2019-05-31 DIAGNOSIS — F418 Other specified anxiety disorders: Secondary | ICD-10-CM | POA: Diagnosis not present

## 2019-08-21 DIAGNOSIS — I83813 Varicose veins of bilateral lower extremities with pain: Secondary | ICD-10-CM | POA: Diagnosis not present

## 2019-08-21 DIAGNOSIS — I83893 Varicose veins of bilateral lower extremities with other complications: Secondary | ICD-10-CM | POA: Diagnosis not present

## 2020-01-09 DIAGNOSIS — R7401 Elevation of levels of liver transaminase levels: Secondary | ICD-10-CM | POA: Diagnosis not present

## 2020-01-09 DIAGNOSIS — F418 Other specified anxiety disorders: Secondary | ICD-10-CM | POA: Diagnosis not present

## 2020-01-09 DIAGNOSIS — J438 Other emphysema: Secondary | ICD-10-CM | POA: Diagnosis not present

## 2020-01-09 DIAGNOSIS — E7800 Pure hypercholesterolemia, unspecified: Secondary | ICD-10-CM | POA: Diagnosis not present

## 2020-01-09 DIAGNOSIS — I10 Essential (primary) hypertension: Secondary | ICD-10-CM | POA: Diagnosis not present

## 2020-01-09 DIAGNOSIS — Z0001 Encounter for general adult medical examination with abnormal findings: Secondary | ICD-10-CM | POA: Diagnosis not present

## 2020-01-09 DIAGNOSIS — N3000 Acute cystitis without hematuria: Secondary | ICD-10-CM | POA: Diagnosis not present

## 2020-01-23 DIAGNOSIS — J438 Other emphysema: Secondary | ICD-10-CM | POA: Diagnosis not present

## 2020-01-23 DIAGNOSIS — I10 Essential (primary) hypertension: Secondary | ICD-10-CM | POA: Diagnosis not present

## 2020-01-23 DIAGNOSIS — F418 Other specified anxiety disorders: Secondary | ICD-10-CM | POA: Diagnosis not present

## 2020-01-23 DIAGNOSIS — E7800 Pure hypercholesterolemia, unspecified: Secondary | ICD-10-CM | POA: Diagnosis not present

## 2020-01-23 DIAGNOSIS — Z720 Tobacco use: Secondary | ICD-10-CM | POA: Diagnosis not present

## 2020-01-23 DIAGNOSIS — R7401 Elevation of levels of liver transaminase levels: Secondary | ICD-10-CM | POA: Diagnosis not present

## 2020-03-26 DIAGNOSIS — E7800 Pure hypercholesterolemia, unspecified: Secondary | ICD-10-CM | POA: Diagnosis not present

## 2020-03-26 DIAGNOSIS — F418 Other specified anxiety disorders: Secondary | ICD-10-CM | POA: Diagnosis not present

## 2020-03-26 DIAGNOSIS — Z720 Tobacco use: Secondary | ICD-10-CM | POA: Diagnosis not present

## 2020-03-26 DIAGNOSIS — J438 Other emphysema: Secondary | ICD-10-CM | POA: Diagnosis not present

## 2020-03-26 DIAGNOSIS — I10 Essential (primary) hypertension: Secondary | ICD-10-CM | POA: Diagnosis not present

## 2020-03-26 DIAGNOSIS — R7401 Elevation of levels of liver transaminase levels: Secondary | ICD-10-CM | POA: Diagnosis not present

## 2021-01-20 DIAGNOSIS — E7800 Pure hypercholesterolemia, unspecified: Secondary | ICD-10-CM | POA: Diagnosis not present

## 2021-01-20 DIAGNOSIS — J438 Other emphysema: Secondary | ICD-10-CM | POA: Diagnosis not present

## 2021-01-20 DIAGNOSIS — I10 Essential (primary) hypertension: Secondary | ICD-10-CM | POA: Diagnosis not present

## 2021-01-20 DIAGNOSIS — R7401 Elevation of levels of liver transaminase levels: Secondary | ICD-10-CM | POA: Diagnosis not present

## 2021-01-20 DIAGNOSIS — Z0001 Encounter for general adult medical examination with abnormal findings: Secondary | ICD-10-CM | POA: Diagnosis not present

## 2021-01-20 DIAGNOSIS — F418 Other specified anxiety disorders: Secondary | ICD-10-CM | POA: Diagnosis not present

## 2021-01-20 DIAGNOSIS — Z720 Tobacco use: Secondary | ICD-10-CM | POA: Diagnosis not present

## 2021-01-30 DIAGNOSIS — I10 Essential (primary) hypertension: Secondary | ICD-10-CM | POA: Diagnosis not present

## 2021-01-30 DIAGNOSIS — Z720 Tobacco use: Secondary | ICD-10-CM | POA: Diagnosis not present

## 2021-01-30 DIAGNOSIS — F418 Other specified anxiety disorders: Secondary | ICD-10-CM | POA: Diagnosis not present

## 2021-01-30 DIAGNOSIS — E7800 Pure hypercholesterolemia, unspecified: Secondary | ICD-10-CM | POA: Diagnosis not present

## 2021-01-30 DIAGNOSIS — R7401 Elevation of levels of liver transaminase levels: Secondary | ICD-10-CM | POA: Diagnosis not present

## 2021-01-30 DIAGNOSIS — J438 Other emphysema: Secondary | ICD-10-CM | POA: Diagnosis not present

## 2021-04-24 DIAGNOSIS — Z20822 Contact with and (suspected) exposure to covid-19: Secondary | ICD-10-CM | POA: Diagnosis not present

## 2021-07-20 DIAGNOSIS — Z20822 Contact with and (suspected) exposure to covid-19: Secondary | ICD-10-CM | POA: Diagnosis not present

## 2021-07-20 DIAGNOSIS — J449 Chronic obstructive pulmonary disease, unspecified: Secondary | ICD-10-CM | POA: Diagnosis not present

## 2021-07-20 DIAGNOSIS — U071 COVID-19: Secondary | ICD-10-CM | POA: Diagnosis not present

## 2021-07-26 DIAGNOSIS — U071 COVID-19: Secondary | ICD-10-CM | POA: Diagnosis not present

## 2021-07-26 DIAGNOSIS — F17200 Nicotine dependence, unspecified, uncomplicated: Secondary | ICD-10-CM | POA: Diagnosis not present

## 2021-07-26 DIAGNOSIS — F17210 Nicotine dependence, cigarettes, uncomplicated: Secondary | ICD-10-CM | POA: Diagnosis not present

## 2021-07-26 DIAGNOSIS — J449 Chronic obstructive pulmonary disease, unspecified: Secondary | ICD-10-CM | POA: Diagnosis not present

## 2021-09-15 DIAGNOSIS — Z20822 Contact with and (suspected) exposure to covid-19: Secondary | ICD-10-CM | POA: Diagnosis not present

## 2021-10-26 DIAGNOSIS — F418 Other specified anxiety disorders: Secondary | ICD-10-CM | POA: Diagnosis not present

## 2021-10-26 DIAGNOSIS — I10 Essential (primary) hypertension: Secondary | ICD-10-CM | POA: Diagnosis not present

## 2021-10-26 DIAGNOSIS — R7401 Elevation of levels of liver transaminase levels: Secondary | ICD-10-CM | POA: Diagnosis not present

## 2021-10-26 DIAGNOSIS — J438 Other emphysema: Secondary | ICD-10-CM | POA: Diagnosis not present

## 2021-10-26 DIAGNOSIS — E7800 Pure hypercholesterolemia, unspecified: Secondary | ICD-10-CM | POA: Diagnosis not present

## 2021-10-26 DIAGNOSIS — Z720 Tobacco use: Secondary | ICD-10-CM | POA: Diagnosis not present

## 2021-12-09 DIAGNOSIS — Z20822 Contact with and (suspected) exposure to covid-19: Secondary | ICD-10-CM | POA: Diagnosis not present

## 2021-12-23 DIAGNOSIS — Z20822 Contact with and (suspected) exposure to covid-19: Secondary | ICD-10-CM | POA: Diagnosis not present

## 2022-01-01 DIAGNOSIS — Z20822 Contact with and (suspected) exposure to covid-19: Secondary | ICD-10-CM | POA: Diagnosis not present

## 2022-01-15 DIAGNOSIS — Z20828 Contact with and (suspected) exposure to other viral communicable diseases: Secondary | ICD-10-CM | POA: Diagnosis not present

## 2022-01-23 DIAGNOSIS — Z20822 Contact with and (suspected) exposure to covid-19: Secondary | ICD-10-CM | POA: Diagnosis not present

## 2022-02-02 DIAGNOSIS — Z20822 Contact with and (suspected) exposure to covid-19: Secondary | ICD-10-CM | POA: Diagnosis not present

## 2022-02-12 DIAGNOSIS — Z20822 Contact with and (suspected) exposure to covid-19: Secondary | ICD-10-CM | POA: Diagnosis not present

## 2022-02-20 DIAGNOSIS — K760 Fatty (change of) liver, not elsewhere classified: Secondary | ICD-10-CM | POA: Diagnosis not present

## 2022-02-20 DIAGNOSIS — E7800 Pure hypercholesterolemia, unspecified: Secondary | ICD-10-CM | POA: Diagnosis not present

## 2022-02-20 DIAGNOSIS — J449 Chronic obstructive pulmonary disease, unspecified: Secondary | ICD-10-CM | POA: Diagnosis not present

## 2022-02-20 DIAGNOSIS — I130 Hypertensive heart and chronic kidney disease with heart failure and stage 1 through stage 4 chronic kidney disease, or unspecified chronic kidney disease: Secondary | ICD-10-CM | POA: Diagnosis not present

## 2022-02-20 DIAGNOSIS — Z6841 Body Mass Index (BMI) 40.0 and over, adult: Secondary | ICD-10-CM | POA: Diagnosis not present

## 2022-02-22 DIAGNOSIS — Z6833 Body mass index (BMI) 33.0-33.9, adult: Secondary | ICD-10-CM | POA: Diagnosis not present

## 2022-02-22 DIAGNOSIS — I130 Hypertensive heart and chronic kidney disease with heart failure and stage 1 through stage 4 chronic kidney disease, or unspecified chronic kidney disease: Secondary | ICD-10-CM | POA: Diagnosis not present

## 2022-02-22 DIAGNOSIS — E7800 Pure hypercholesterolemia, unspecified: Secondary | ICD-10-CM | POA: Diagnosis not present

## 2022-02-22 DIAGNOSIS — K760 Fatty (change of) liver, not elsewhere classified: Secondary | ICD-10-CM | POA: Diagnosis not present

## 2022-02-22 DIAGNOSIS — J449 Chronic obstructive pulmonary disease, unspecified: Secondary | ICD-10-CM | POA: Diagnosis not present

## 2022-03-11 DIAGNOSIS — F410 Panic disorder [episodic paroxysmal anxiety] without agoraphobia: Secondary | ICD-10-CM | POA: Diagnosis not present

## 2022-03-11 DIAGNOSIS — R635 Abnormal weight gain: Secondary | ICD-10-CM | POA: Diagnosis not present

## 2022-03-11 DIAGNOSIS — E0590 Thyrotoxicosis, unspecified without thyrotoxic crisis or storm: Secondary | ICD-10-CM | POA: Diagnosis not present

## 2022-03-11 DIAGNOSIS — E042 Nontoxic multinodular goiter: Secondary | ICD-10-CM | POA: Diagnosis not present

## 2022-03-18 DIAGNOSIS — I130 Hypertensive heart and chronic kidney disease with heart failure and stage 1 through stage 4 chronic kidney disease, or unspecified chronic kidney disease: Secondary | ICD-10-CM | POA: Diagnosis not present

## 2022-03-22 DIAGNOSIS — I130 Hypertensive heart and chronic kidney disease with heart failure and stage 1 through stage 4 chronic kidney disease, or unspecified chronic kidney disease: Secondary | ICD-10-CM | POA: Diagnosis not present

## 2022-03-22 DIAGNOSIS — Z6835 Body mass index (BMI) 35.0-35.9, adult: Secondary | ICD-10-CM | POA: Diagnosis not present

## 2022-03-23 DIAGNOSIS — Z6835 Body mass index (BMI) 35.0-35.9, adult: Secondary | ICD-10-CM | POA: Diagnosis not present

## 2022-03-23 DIAGNOSIS — I130 Hypertensive heart and chronic kidney disease with heart failure and stage 1 through stage 4 chronic kidney disease, or unspecified chronic kidney disease: Secondary | ICD-10-CM | POA: Diagnosis not present

## 2022-03-30 DIAGNOSIS — Z7901 Long term (current) use of anticoagulants: Secondary | ICD-10-CM | POA: Diagnosis not present

## 2022-03-30 DIAGNOSIS — K7402 Hepatic fibrosis, advanced fibrosis: Secondary | ICD-10-CM | POA: Diagnosis not present

## 2022-03-30 DIAGNOSIS — K760 Fatty (change of) liver, not elsewhere classified: Secondary | ICD-10-CM | POA: Diagnosis not present

## 2022-03-30 DIAGNOSIS — R768 Other specified abnormal immunological findings in serum: Secondary | ICD-10-CM | POA: Diagnosis not present

## 2022-04-07 DIAGNOSIS — E0590 Thyrotoxicosis, unspecified without thyrotoxic crisis or storm: Secondary | ICD-10-CM | POA: Diagnosis not present

## 2022-04-07 DIAGNOSIS — Z6835 Body mass index (BMI) 35.0-35.9, adult: Secondary | ICD-10-CM | POA: Diagnosis not present

## 2022-04-07 DIAGNOSIS — I130 Hypertensive heart and chronic kidney disease with heart failure and stage 1 through stage 4 chronic kidney disease, or unspecified chronic kidney disease: Secondary | ICD-10-CM | POA: Diagnosis not present

## 2022-04-23 DIAGNOSIS — K7402 Hepatic fibrosis, advanced fibrosis: Secondary | ICD-10-CM | POA: Diagnosis not present

## 2022-05-04 DIAGNOSIS — J449 Chronic obstructive pulmonary disease, unspecified: Secondary | ICD-10-CM | POA: Diagnosis not present

## 2022-05-04 DIAGNOSIS — F32A Depression, unspecified: Secondary | ICD-10-CM | POA: Diagnosis not present

## 2022-05-04 DIAGNOSIS — R601 Generalized edema: Secondary | ICD-10-CM | POA: Diagnosis not present

## 2022-05-04 DIAGNOSIS — M7989 Other specified soft tissue disorders: Secondary | ICD-10-CM | POA: Diagnosis not present

## 2022-05-04 DIAGNOSIS — Z6835 Body mass index (BMI) 35.0-35.9, adult: Secondary | ICD-10-CM | POA: Diagnosis not present

## 2022-05-05 DIAGNOSIS — Z6835 Body mass index (BMI) 35.0-35.9, adult: Secondary | ICD-10-CM | POA: Diagnosis not present

## 2022-05-05 DIAGNOSIS — R0602 Shortness of breath: Secondary | ICD-10-CM | POA: Diagnosis not present

## 2022-05-05 DIAGNOSIS — R601 Generalized edema: Secondary | ICD-10-CM | POA: Diagnosis not present

## 2022-05-06 DIAGNOSIS — Z6835 Body mass index (BMI) 35.0-35.9, adult: Secondary | ICD-10-CM | POA: Diagnosis not present

## 2022-05-06 DIAGNOSIS — I130 Hypertensive heart and chronic kidney disease with heart failure and stage 1 through stage 4 chronic kidney disease, or unspecified chronic kidney disease: Secondary | ICD-10-CM | POA: Diagnosis not present

## 2022-05-06 DIAGNOSIS — K754 Autoimmune hepatitis: Secondary | ICD-10-CM | POA: Diagnosis not present

## 2022-05-07 DIAGNOSIS — G43009 Migraine without aura, not intractable, without status migrainosus: Secondary | ICD-10-CM | POA: Diagnosis not present

## 2022-05-07 DIAGNOSIS — Z6834 Body mass index (BMI) 34.0-34.9, adult: Secondary | ICD-10-CM | POA: Diagnosis not present

## 2022-05-07 DIAGNOSIS — K754 Autoimmune hepatitis: Secondary | ICD-10-CM | POA: Diagnosis not present

## 2022-05-08 DIAGNOSIS — Z6834 Body mass index (BMI) 34.0-34.9, adult: Secondary | ICD-10-CM | POA: Diagnosis not present

## 2022-05-08 DIAGNOSIS — K754 Autoimmune hepatitis: Secondary | ICD-10-CM | POA: Diagnosis not present

## 2022-05-12 DIAGNOSIS — K754 Autoimmune hepatitis: Secondary | ICD-10-CM | POA: Diagnosis not present

## 2022-05-12 DIAGNOSIS — I130 Hypertensive heart and chronic kidney disease with heart failure and stage 1 through stage 4 chronic kidney disease, or unspecified chronic kidney disease: Secondary | ICD-10-CM | POA: Diagnosis not present

## 2022-05-12 DIAGNOSIS — Z6833 Body mass index (BMI) 33.0-33.9, adult: Secondary | ICD-10-CM | POA: Diagnosis not present

## 2022-05-13 DIAGNOSIS — R188 Other ascites: Secondary | ICD-10-CM | POA: Diagnosis not present

## 2022-05-13 DIAGNOSIS — R945 Abnormal results of liver function studies: Secondary | ICD-10-CM | POA: Diagnosis not present

## 2022-05-13 DIAGNOSIS — K754 Autoimmune hepatitis: Secondary | ICD-10-CM | POA: Diagnosis not present

## 2022-05-17 DIAGNOSIS — Z6832 Body mass index (BMI) 32.0-32.9, adult: Secondary | ICD-10-CM | POA: Diagnosis not present

## 2022-05-17 DIAGNOSIS — K754 Autoimmune hepatitis: Secondary | ICD-10-CM | POA: Diagnosis not present

## 2022-05-20 DIAGNOSIS — R188 Other ascites: Secondary | ICD-10-CM | POA: Diagnosis not present

## 2022-05-20 DIAGNOSIS — R140 Abdominal distension (gaseous): Secondary | ICD-10-CM | POA: Diagnosis not present

## 2022-05-20 DIAGNOSIS — K7460 Unspecified cirrhosis of liver: Secondary | ICD-10-CM | POA: Diagnosis not present

## 2022-05-26 DIAGNOSIS — I130 Hypertensive heart and chronic kidney disease with heart failure and stage 1 through stage 4 chronic kidney disease, or unspecified chronic kidney disease: Secondary | ICD-10-CM | POA: Diagnosis not present

## 2022-05-26 DIAGNOSIS — K754 Autoimmune hepatitis: Secondary | ICD-10-CM | POA: Diagnosis not present

## 2022-05-26 DIAGNOSIS — K7682 Hepatic encephalopathy: Secondary | ICD-10-CM | POA: Diagnosis not present

## 2022-05-26 DIAGNOSIS — Z6830 Body mass index (BMI) 30.0-30.9, adult: Secondary | ICD-10-CM | POA: Diagnosis not present

## 2022-06-03 DIAGNOSIS — Z6829 Body mass index (BMI) 29.0-29.9, adult: Secondary | ICD-10-CM | POA: Diagnosis not present

## 2022-06-03 DIAGNOSIS — I130 Hypertensive heart and chronic kidney disease with heart failure and stage 1 through stage 4 chronic kidney disease, or unspecified chronic kidney disease: Secondary | ICD-10-CM | POA: Diagnosis not present

## 2022-06-03 DIAGNOSIS — K7682 Hepatic encephalopathy: Secondary | ICD-10-CM | POA: Diagnosis not present

## 2022-06-03 DIAGNOSIS — Y92009 Unspecified place in unspecified non-institutional (private) residence as the place of occurrence of the external cause: Secondary | ICD-10-CM | POA: Diagnosis not present

## 2022-06-03 DIAGNOSIS — W19XXXA Unspecified fall, initial encounter: Secondary | ICD-10-CM | POA: Diagnosis not present

## 2022-06-10 ENCOUNTER — Telehealth: Payer: Self-pay

## 2022-06-10 NOTE — Telephone Encounter (Signed)
Attempted to contact patient to schedule a Palliative Care consult appointment. No answer left a message to return call.  

## 2022-06-15 ENCOUNTER — Telehealth: Payer: Self-pay

## 2022-06-15 NOTE — Telephone Encounter (Signed)
Attempted to contact patient to schedule a Palliative Care consult appointment. No answer left a message to return call.  

## 2022-06-22 ENCOUNTER — Telehealth: Payer: Self-pay

## 2022-06-22 NOTE — Telephone Encounter (Signed)
Several attempts to contact patient/patient's daughter Sarah Jordan to schedule a Palliative Care consult appointment. No answer left a message to return call. If no response by 9/14 will cancel referral.
